# Patient Record
Sex: Male | Born: 2000 | Hispanic: Refuse to answer | Marital: Single | Smoking: Never smoker
Health system: Southern US, Community
[De-identification: ages and names within clinical notes are randomized; demographics above are authoritative.]

---
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
## 2020-01-21 IMAGING — CR DG WRIST COMPLETE 3+V*R*
1 series · 4 of 4 positions shown · non-contrast
Comparison: None.

CLINICAL DATA: Baseball pitcher, pain along the radial aspect of
the wrist, primarily pain at the base of the second and fourth
metacarpals over the last month

EXAM:
RIGHT WRIST - COMPLETE 3+ VIEW

[Series 1: dg wrist complete right · 0.14mm/px · 4 of 4 slices shown]
[im 1/4]
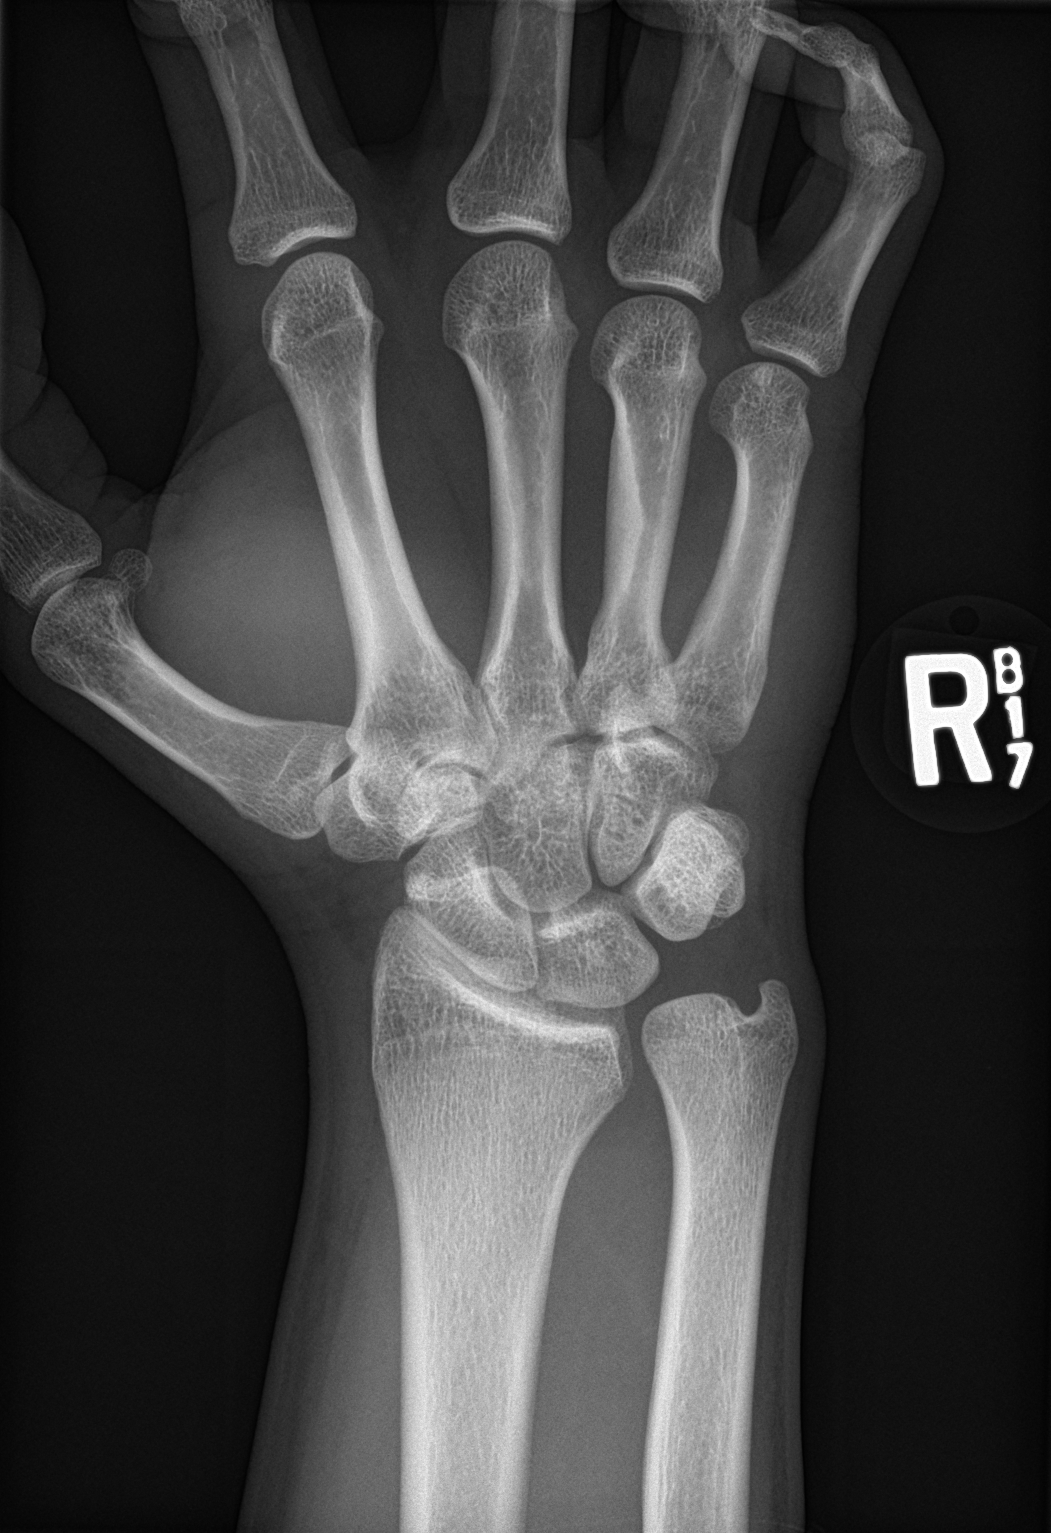
[im 2/4]
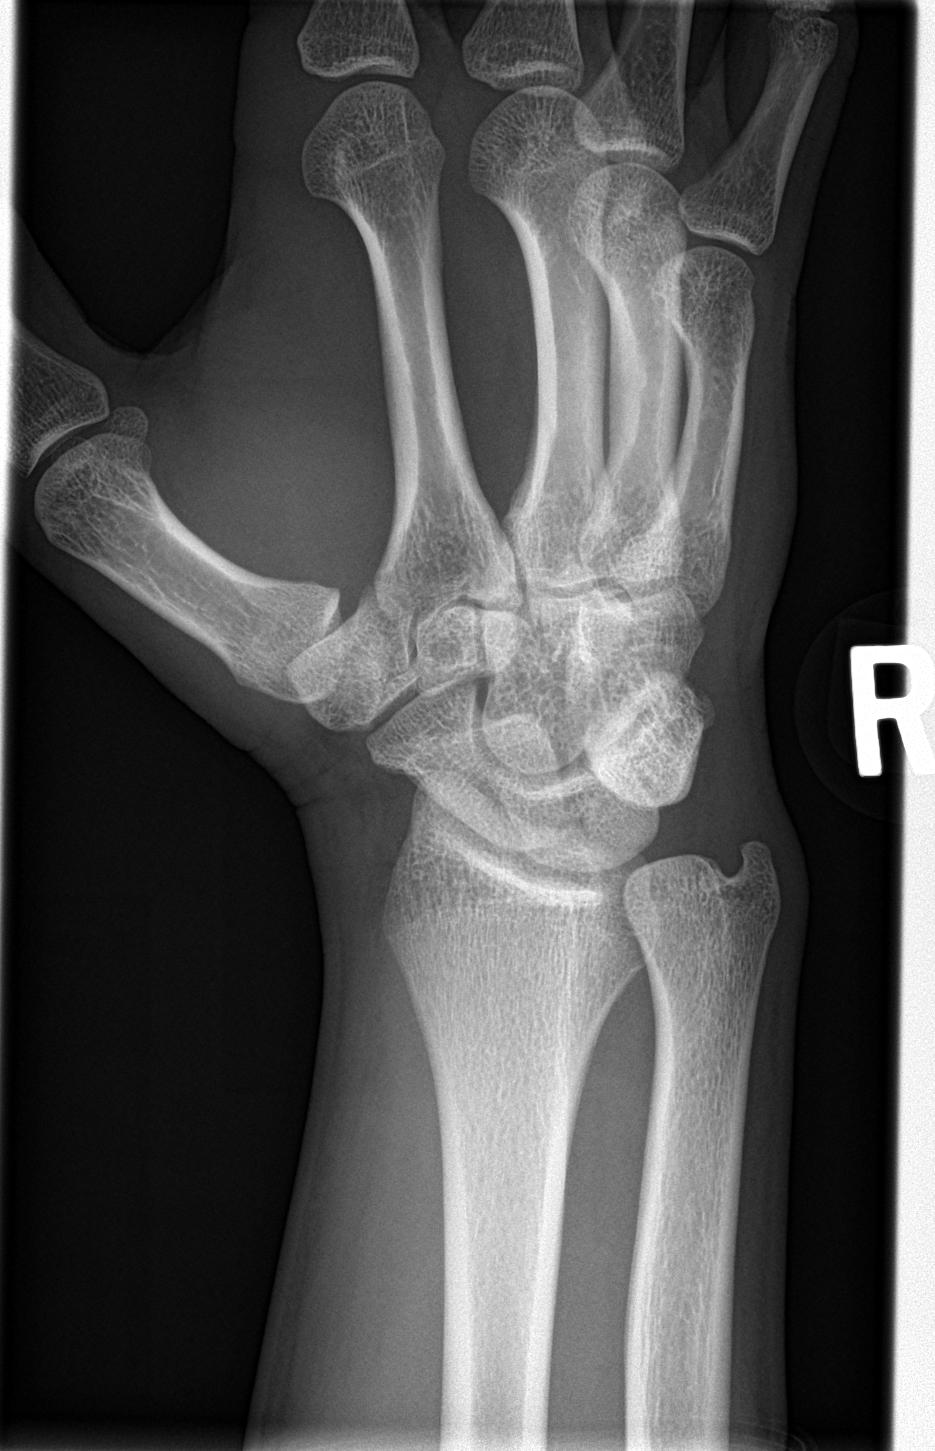
[im 3/4]
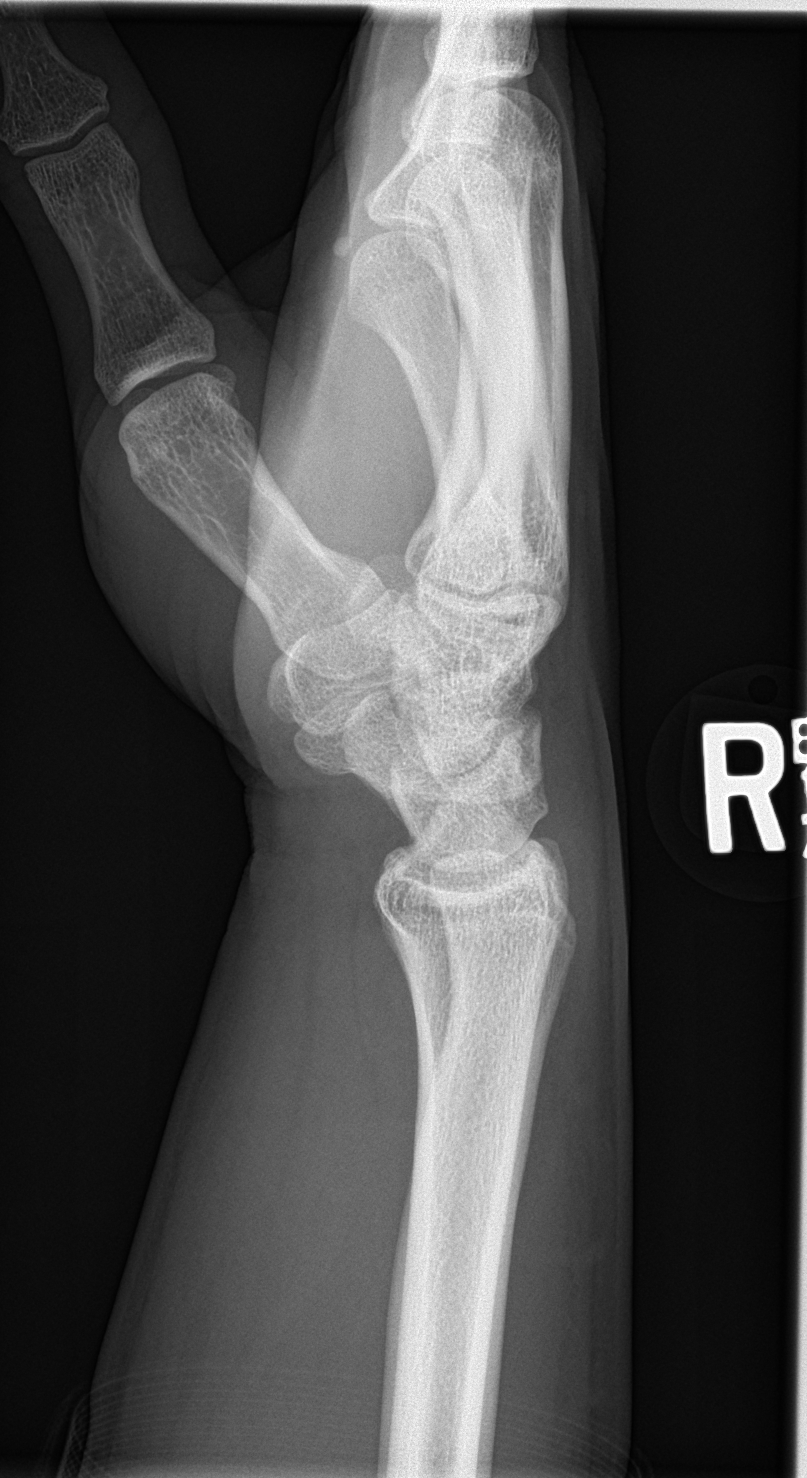
[im 4/4]
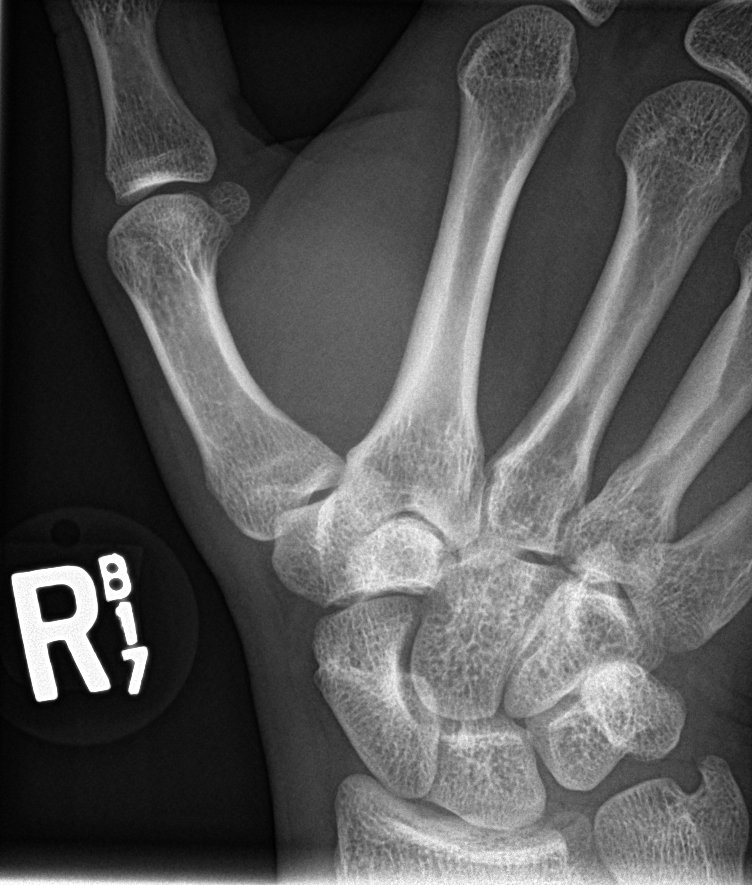

[4 of 4 positions shown; findings below may reference images not displayed]

FINDINGS: There is a ulnar positive variance present of approximately 4 mm.
This abnormality can lead to ulnar impaction syndrome. The carpal
bones appear to be in normal position with no evidence of avascular
necrosis or significant degenerative change. Alignment of the carpal
bones is normal. No evidence of stress fracture is seen.
IMPRESSION: 1. No acute abnormality.
2. Ulnar positive variance can be associated with ulnar impaction
syndrome
